# Patient Record
Sex: Female | Born: 1990 | Race: White | Hispanic: No | Marital: Married | Smoking: Never smoker
Health system: Southern US, Community
[De-identification: ages and names within clinical notes are randomized; demographics above are authoritative.]

## PROBLEM LIST (undated history)

## (undated) DIAGNOSIS — Z789 Other specified health status: Secondary | ICD-10-CM

## (undated) HISTORY — PX: NO PAST SURGERIES: SHX2092

---
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
## 2020-11-07 NOTE — L&D Delivery Note (Signed)
Delivery Note  First Stage: Labor onset: 0300 Augmentation: AROM and Pitocin Analgesia /Anesthesia intrapartum: epidural AROM 09/30/21 at 1550  Second Stage: Complete dilation 11/25 at 0031 Onset of pushing 11/25 at 0031 FHR second stage Cat II, fetal tachy 170bpm with early decels.   Delivery of a viable female on 10/01/21 at 0230  by CNM delivery of fetal head in LOA position with restitution to LOT. No nuchal cord;  Anterior then posterior shoulders delivered easily with gentle downward traction. Baby placed on mom's chest, and attended to by peds.  Cord double clamped after cessation of pulsation, cut by FOB. Cord blood sample collected    Third Stage: Placenta delivered spontaneously intact with 3 VC @ 0237 Placenta disposition: routine disposal Uterine tone Firm / bleeding scant  2nd deg perineal and left periurethral lacerations identified  Anesthesia for repair: epidural Repair 2-0 Vicryl CT-1 and 3-0 Vicryl SH Est. Blood Loss (mL): 150  Complications: none  Mom to postpartum.  Baby to Couplet care / Skin to Skin.  Newborn: Birth Weight: pending  Apgar Scores: 8/9 Feeding planned: breast

## 2021-03-10 LAB — OB RESULTS CONSOLE VARICELLA ZOSTER ANTIBODY, IGG: Varicella: IMMUNE

## 2021-03-10 LAB — OB RESULTS CONSOLE RUBELLA ANTIBODY, IGM: Rubella: IMMUNE

## 2021-09-02 LAB — OB RESULTS CONSOLE RPR: RPR: NONREACTIVE

## 2021-09-02 LAB — OB RESULTS CONSOLE HIV ANTIBODY (ROUTINE TESTING): HIV: NONREACTIVE

## 2021-09-02 LAB — OB RESULTS CONSOLE GBS: GBS: NEGATIVE

## 2021-09-30 ENCOUNTER — Inpatient Hospital Stay
Admission: EM | Admit: 2021-09-30 | Discharge: 2021-10-02 | DRG: 807 | Disposition: A | Discharge status: Home / Self Care | Payer: Managed Care, Other (non HMO) | Attending: Obstetrics | Admitting: Obstetrics

## 2021-09-30 ENCOUNTER — Other Ambulatory Visit: Payer: Self-pay

## 2021-09-30 ENCOUNTER — Encounter: Payer: Self-pay | Admitting: Obstetrics and Gynecology

## 2021-09-30 ENCOUNTER — Observation Stay
Admission: EM | Admit: 2021-09-30 | Discharge: 2021-09-30 | Disposition: A | Discharge status: Home / Self Care | Payer: Managed Care, Other (non HMO) | Source: Home / Self Care | Admitting: Obstetrics and Gynecology

## 2021-09-30 ENCOUNTER — Inpatient Hospital Stay: Payer: Managed Care, Other (non HMO) | Admitting: Anesthesiology

## 2021-09-30 DIAGNOSIS — Z3A40 40 weeks gestation of pregnancy: Secondary | ICD-10-CM | POA: Diagnosis not present

## 2021-09-30 DIAGNOSIS — Z20822 Contact with and (suspected) exposure to covid-19: Secondary | ICD-10-CM | POA: Diagnosis present

## 2021-09-30 DIAGNOSIS — O480 Post-term pregnancy: Secondary | ICD-10-CM | POA: Diagnosis present

## 2021-09-30 DIAGNOSIS — O26893 Other specified pregnancy related conditions, third trimester: Principal | ICD-10-CM | POA: Diagnosis present

## 2021-09-30 DIAGNOSIS — Z6791 Unspecified blood type, Rh negative: Secondary | ICD-10-CM | POA: Diagnosis not present

## 2021-09-30 DIAGNOSIS — O471 False labor at or after 37 completed weeks of gestation: Secondary | ICD-10-CM | POA: Insufficient documentation

## 2021-09-30 DIAGNOSIS — D519 Vitamin B12 deficiency anemia, unspecified: Secondary | ICD-10-CM | POA: Insufficient documentation

## 2021-09-30 DIAGNOSIS — N83201 Unspecified ovarian cyst, right side: Secondary | ICD-10-CM | POA: Diagnosis present

## 2021-09-30 DIAGNOSIS — O3483 Maternal care for other abnormalities of pelvic organs, third trimester: Secondary | ICD-10-CM | POA: Insufficient documentation

## 2021-09-30 DIAGNOSIS — O99283 Endocrine, nutritional and metabolic diseases complicating pregnancy, third trimester: Secondary | ICD-10-CM | POA: Insufficient documentation

## 2021-09-30 DIAGNOSIS — O479 False labor, unspecified: Secondary | ICD-10-CM | POA: Diagnosis present

## 2021-09-30 DIAGNOSIS — E559 Vitamin D deficiency, unspecified: Secondary | ICD-10-CM | POA: Insufficient documentation

## 2021-09-30 HISTORY — DX: Other specified health status: Z78.9

## 2021-09-30 LAB — CBC
HCT: 34.8 % — ABNORMAL LOW (ref 36.0–46.0)
Hemoglobin: 11.9 g/dL — ABNORMAL LOW (ref 12.0–15.0)
MCH: 28.7 pg (ref 26.0–34.0)
MCHC: 34.2 g/dL (ref 30.0–36.0)
MCV: 84.1 fL (ref 80.0–100.0)
Platelets: 208 10*3/uL (ref 150–400)
RBC: 4.14 MIL/uL (ref 3.87–5.11)
RDW: 13.2 % (ref 11.5–15.5)
WBC: 14 10*3/uL — ABNORMAL HIGH (ref 4.0–10.5)
nRBC: 0 % (ref 0.0–0.2)

## 2021-09-30 LAB — ABO/RH: ABO/RH(D): A NEG

## 2021-09-30 LAB — TYPE AND SCREEN
ABO/RH(D): A NEG
Antibody Screen: POSITIVE

## 2021-09-30 LAB — RESP PANEL BY RT-PCR (FLU A&B, COVID) ARPGX2
Influenza A by PCR: NEGATIVE
Influenza B by PCR: NEGATIVE
SARS Coronavirus 2 by RT PCR: NEGATIVE

## 2021-09-30 MED ORDER — FENTANYL-BUPIVACAINE-NACL 0.5-0.125-0.9 MG/250ML-% EP SOLN
12.0000 mL/h | EPIDURAL | Status: DC | PRN
  Administered 2021-09-30: 12 mL/h via EPIDURAL

## 2021-09-30 MED ORDER — DIPHENHYDRAMINE HCL 50 MG/ML IJ SOLN: 12.5000 mg | INTRAMUSCULAR | Status: DC | PRN

## 2021-09-30 MED ORDER — LIDOCAINE HCL (PF) 1 % IJ SOLN
30.0000 mL | INTRAMUSCULAR | Status: DC | PRN
  Filled 2021-09-30: qty 30

## 2021-09-30 MED ORDER — OXYTOCIN-SODIUM CHLORIDE 30-0.9 UT/500ML-% IV SOLN
1.0000 m[IU]/min | INTRAVENOUS | Status: DC
  Administered 2021-09-30: 2 m[IU]/min via INTRAVENOUS

## 2021-09-30 MED ORDER — SOD CITRATE-CITRIC ACID 500-334 MG/5ML PO SOLN: 30.0000 mL | ORAL | Status: DC | PRN

## 2021-09-30 MED ORDER — ONDANSETRON HCL 4 MG/2ML IJ SOLN
INTRAMUSCULAR | Status: AC
  Filled 2021-09-30: qty 2

## 2021-09-30 MED ORDER — FENTANYL-BUPIVACAINE-NACL 0.5-0.125-0.9 MG/250ML-% EP SOLN
EPIDURAL | Status: AC
  Filled 2021-09-30: qty 250

## 2021-09-30 MED ORDER — PHENYLEPHRINE 40 MCG/ML (10ML) SYRINGE FOR IV PUSH (FOR BLOOD PRESSURE SUPPORT)
80.0000 ug | PREFILLED_SYRINGE | INTRAVENOUS | Status: DC | PRN
  Filled 2021-09-30: qty 10

## 2021-09-30 MED ORDER — ONDANSETRON HCL 4 MG/2ML IJ SOLN
4.0000 mg | Freq: Four times a day (QID) | INTRAMUSCULAR | Status: DC | PRN
  Administered 2021-09-30: 4 mg via INTRAVENOUS

## 2021-09-30 MED ORDER — OXYTOCIN BOLUS FROM INFUSION
333.0000 mL | Freq: Once | INTRAVENOUS | Status: AC
  Administered 2021-10-01: 333 mL via INTRAVENOUS

## 2021-09-30 MED ORDER — ACETAMINOPHEN 325 MG PO TABS: 650.0000 mg | ORAL_TABLET | ORAL | Status: DC | PRN

## 2021-09-30 MED ORDER — DOCUSATE SODIUM 100 MG PO CAPS: 100.0000 mg | ORAL_CAPSULE | Freq: Every day | ORAL | Status: DC

## 2021-09-30 MED ORDER — TERBUTALINE SULFATE 1 MG/ML IJ SOLN: 0.2500 mg | Freq: Once | INTRAMUSCULAR | Status: DC | PRN

## 2021-09-30 MED ORDER — PRENATAL MULTIVITAMIN CH: 1.0000 | ORAL_TABLET | Freq: Every day | ORAL | Status: DC

## 2021-09-30 MED ORDER — LACTATED RINGERS IV SOLN
500.0000 mL | Freq: Once | INTRAVENOUS | Status: AC
  Administered 2021-09-30: 500 mL via INTRAVENOUS

## 2021-09-30 MED ORDER — FENTANYL CITRATE (PF) 100 MCG/2ML IJ SOLN
100.0000 ug | INTRAMUSCULAR | Status: DC | PRN
  Administered 2021-09-30: 100 ug via INTRAVENOUS

## 2021-09-30 MED ORDER — SODIUM CHLORIDE 0.9 % IV SOLN
INTRAVENOUS | Status: DC | PRN
  Administered 2021-09-30 (×4): 5 mL via EPIDURAL

## 2021-09-30 MED ORDER — LACTATED RINGERS IV SOLN
500.0000 mL | INTRAVENOUS | Status: DC | PRN
  Administered 2021-09-30: 500 mL via INTRAVENOUS

## 2021-09-30 MED ORDER — CALCIUM CARBONATE ANTACID 500 MG PO CHEW: 2.0000 | CHEWABLE_TABLET | ORAL | Status: DC | PRN

## 2021-09-30 MED ORDER — OXYTOCIN-SODIUM CHLORIDE 30-0.9 UT/500ML-% IV SOLN
2.5000 [IU]/h | INTRAVENOUS | Status: DC
  Filled 2021-09-30: qty 500

## 2021-09-30 MED ORDER — FENTANYL CITRATE (PF) 100 MCG/2ML IJ SOLN
INTRAMUSCULAR | Status: AC
  Filled 2021-09-30: qty 2

## 2021-09-30 MED ORDER — OXYTOCIN-SODIUM CHLORIDE 30-0.9 UT/500ML-% IV SOLN
INTRAVENOUS | Status: AC
  Filled 2021-09-30: qty 1000

## 2021-09-30 MED ORDER — EPHEDRINE 5 MG/ML INJ
10.0000 mg | INTRAVENOUS | Status: DC | PRN
  Filled 2021-09-30: qty 2

## 2021-09-30 MED ORDER — LIDOCAINE HCL (PF) 1 % IJ SOLN
INTRAMUSCULAR | Status: DC | PRN
  Administered 2021-09-30 (×2): 1 mL via SUBCUTANEOUS

## 2021-09-30 MED ORDER — MISOPROSTOL 200 MCG PO TABS
ORAL_TABLET | ORAL | Status: AC
  Filled 2021-09-30: qty 4

## 2021-09-30 MED ORDER — ZOLPIDEM TARTRATE 5 MG PO TABS: 5.0000 mg | ORAL_TABLET | Freq: Every evening | ORAL | Status: DC | PRN

## 2021-09-30 MED ORDER — AMMONIA AROMATIC IN INHA
RESPIRATORY_TRACT | Status: AC
  Filled 2021-09-30: qty 10

## 2021-09-30 MED ORDER — LIDOCAINE HCL (PF) 1 % IJ SOLN
INTRAMUSCULAR | Status: AC
  Filled 2021-09-30: qty 30

## 2021-09-30 MED ORDER — OXYTOCIN 10 UNIT/ML IJ SOLN
INTRAMUSCULAR | Status: AC
  Filled 2021-09-30: qty 2

## 2021-09-30 MED ORDER — LIDOCAINE-EPINEPHRINE (PF) 1.5 %-1:200000 IJ SOLN
INTRAMUSCULAR | Status: DC | PRN
  Administered 2021-09-30 (×2): 3 mL via EPIDURAL

## 2021-09-30 MED ORDER — LACTATED RINGERS IV SOLN: INTRAVENOUS | Status: DC

## 2021-09-30 NOTE — OB Triage Note (Signed)
Pt Haley Frye 30 y.o. presents to labor and delivery triage reporting contractions q3-5 minutes for the past 2 hours. Pt is a G1P0 at [redacted]w[redacted]d. Pt denies signs and symptoms consistent with rupture of membranes or active vaginal bleeding. Pt states positive fetal movement. External FM and TOCO applied to non-tender abdomen and assessing. Initial FHR 140. Vital signs obtained and within normal limits. Provider notified of pt.

## 2021-09-30 NOTE — Progress Notes (Signed)
Fetal monitoring removed at this time per provider for intermittent fetal monitoring.

## 2021-09-30 NOTE — Discharge Summary (Signed)
Haley Frye is a 30 y.o. female. She is at [redacted]w[redacted]d gestation. No LMP recorded. Patient is pregnant. Estimated Date of Delivery: 09/26/21  Prenatal care site: [facility]   Current pregnancy complicated by:   Right ovarian cyst  RH Negative: 28 weeks [ x] Antibody Screen neg RHOGAM given [facility] 07/09/21 Vitamin B12 and Vit D deficiency  Chief complaint: Painful uterine contractions  S: Resting comfortably. CTX q3-75min, no VB.no LOF,  Active fetal movement.   Denies: HA, visual changes, SOB, or RUQ/epigastric pain  Maternal Medical History:  History reviewed. No pertinent past medical history.  History reviewed. No pertinent surgical history.  Not on File  Prior to Admission medications   Medication Sig Start Date End Date Taking? Authorizing Provider  Prenatal Vit-Fe Fumarate-FA (PRENATAL MULTIVITAMIN) TABS tablet Take 1 tablet by mouth daily at 12 noon.   Yes [provider]      Social History: She  reports that she has never smoked. She has never used smokeless tobacco. She reports that she does not currently use alcohol. She reports that she does not use drugs.  Family History: family history is not on file.  no history of gyn cancers  Review of Systems: A full review of systems was performed and negative except as noted in the HPI.    O:  BP 115/80 (BP Location: Left Arm)   Pulse 81   Temp 98.1 F (36.7 C) (Oral)   Resp 18   Ht 5\' 6"  (1.676 m)   Wt 94.3 kg   BMI 33.57 kg/m  No results found for this or any previous visit (from the past 48 hour(s)).   Constitutional: NAD, AAOx3  HE/ENT: extraocular movements grossly intact, moist mucous membranes CV: RRR PULM: nl respiratory effort, CTABL     Abd: gravid, non-tender, non-distended, soft      Ext: Non-tender, Nonedematous   Psych: mood appropriate, speech normal Pelvic: 2/50/-3  Pelvic exam: normal external genitalia, vulva, vagina, cervix, uterus and adnexa.  Fetal  monitoring: Cat 1  Baseline:  130bpm Variability: moderate Accelerations: present x >2 Decelerations absent Time  A/P: 30 y.o. [redacted]w[redacted]d here for antenatal surveillance for uterine contractions  Principle Diagnosis:  Normal pregnancy in third trimester  Labor: early labor present. Cervix  minimal change since yesterday. She is handling contractions well.  She and FOB are requesting to be discharged and labor at home until active labor. Reviewed signs of early vs active labor and to return with worsening contractions, leaking fluid, decreased fetal movement, or with any concerns. They are agreeable with POC. Fetal Wellbeing: Reassuring Cat 1 tracing. Reactive NST  D/c home stable, precautions reviewed, follow-up as scheduled.    [redacted]w[redacted]d, CNM 09/30/2021 6:45 AM

## 2021-09-30 NOTE — Anesthesia Procedure Notes (Signed)
Epidural Patient location during procedure: OB Start time: 09/30/2021 8:41 PM End time: 09/30/2021 8:44 PM  Staffing Anesthesiologist: Arne Schlender, Cleda Mccreedy, MD Performed: anesthesiologist   Preanesthetic Checklist Completed: patient identified, IV checked, site marked, risks and benefits discussed, surgical consent, monitors and equipment checked, pre-op evaluation and timeout performed  Epidural Patient position: sitting Prep: ChloraPrep Patient monitoring: heart rate, continuous pulse ox and blood pressure Approach: midline Location: L3-L4 Injection technique: LOR saline  Needle:  Needle type: Tuohy  Needle gauge: 17 G Needle length: 9 cm and 9 Needle insertion depth: 5 cm Catheter type: closed end flexible Catheter size: 19 Gauge Catheter at skin depth: 10 cm Test dose: negative and 1.5% lidocaine with Epi 1:200 K  Assessment Sensory level: T10 Events: blood not aspirated, injection not painful, no injection resistance, no paresthesia and negative IV test  Additional Notes 1 attempt Pt. Evaluated and documentation done after procedure finished. Patient identified. Risks/Benefits/Options discussed with patient including but not limited to bleeding, infection, nerve damage, paralysis, failed block, incomplete pain control, headache, blood pressure changes, nausea, vomiting, reactions to medication both or allergic, itching and postpartum back pain. Confirmed with bedside nurse the patient's most recent platelet count. Confirmed with patient that they are not currently taking any anticoagulation, have any bleeding history or any family history of bleeding disorders. Patient expressed understanding and wished to proceed. All questions were answered. Sterile technique was used throughout the entire procedure. Please see nursing notes for vital signs. Test dose was given through epidural catheter and negative prior to continuing to dose epidural or start infusion. Warning signs of  high block given to the patient including shortness of breath, tingling/numbness in hands, complete motor block, or any concerning symptoms with instructions to call for help. Patient was given instructions on fall risk and not to get out of bed. All questions and concerns addressed with instructions to call with any issues or inadequate analgesia.    Patient tolerated the insertion well without immediate complications.Reason for block:procedure for pain

## 2021-09-30 NOTE — Anesthesia Procedure Notes (Signed)
Epidural Patient location during procedure: OB Start time: 09/30/2021 1:50 PM End time: 09/30/2021 1:52 PM  Staffing Anesthesiologist: Fermon Ureta, Cleda Mccreedy, MD Performed: anesthesiologist   Preanesthetic Checklist Completed: patient identified, IV checked, site marked, risks and benefits discussed, surgical consent, monitors and equipment checked, pre-op evaluation and timeout performed  Epidural Patient position: sitting Prep: ChloraPrep Patient monitoring: heart rate, continuous pulse ox and blood pressure Approach: midline Location: L3-L4 Injection technique: LOR saline  Needle:  Needle type: Tuohy  Needle gauge: 17 G Needle length: 9 cm and 9 Needle insertion depth: 6 cm Catheter type: closed end flexible Catheter size: 19 Gauge Catheter at skin depth: 11 cm Test dose: negative and 1.5% lidocaine with Epi 1:200 K  Assessment Sensory level: T10 Events: blood not aspirated, injection not painful, no injection resistance, no paresthesia and negative IV test  Additional Notes 1 attempt Pt. Evaluated and documentation done after procedure finished. Patient identified. Risks/Benefits/Options discussed with patient including but not limited to bleeding, infection, nerve damage, paralysis, failed block, incomplete pain control, headache, blood pressure changes, nausea, vomiting, reactions to medication both or allergic, itching and postpartum back pain. Confirmed with bedside nurse the patient's most recent platelet count. Confirmed with patient that they are not currently taking any anticoagulation, have any bleeding history or any family history of bleeding disorders. Patient expressed understanding and wished to proceed. All questions were answered. Sterile technique was used throughout the entire procedure. Please see nursing notes for vital signs. Test dose was given through epidural catheter and negative prior to continuing to dose epidural or start infusion. Warning signs of  high block given to the patient including shortness of breath, tingling/numbness in hands, complete motor block, or any concerning symptoms with instructions to call for help. Patient was given instructions on fall risk and not to get out of bed. All questions and concerns addressed with instructions to call with any issues or inadequate analgesia.    Patient tolerated the insertion well without immediate complications.Reason for block:procedure for pain

## 2021-09-30 NOTE — Progress Notes (Signed)
   09/30/21 1545  Clinical Encounter Type  Visited With Patient and family together  Visit Type Initial;Spiritual support;Social support  Referral From Nurse  Consult/Referral To Chaplain  Recommendations Advanced directive  Spiritual Encounters  Spiritual Needs Other (Comment)  Chaplain Burris delivered advanced directive document. Pt and spouse interested in this as they shared that a family member had once been in need of this planning, so this was very relevant. Pt has begun epidural, so did not prolong discussion but left document for them. Encouraged them to let nursing staff know when they may want to complete this.

## 2021-09-30 NOTE — Progress Notes (Signed)
Labor Progress Note  Sammantha Mehlhaff is a 30 y.o. G1P0 at [redacted]w[redacted]d by LMP admitted for latent labor.   Subjective: feeling more discomfort again, mostly in lower abdomen and hips bilaterally. Right leg remains very numb.   Objective: BP (!) 119/59   Pulse 73   Temp 98.2 F (36.8 C) (Oral)   Resp 18   Ht 5\' 6"  (1.676 m)   Wt 94.3 kg   SpO2 99%   BMI 33.57 kg/m  Notable VS details: reviewed  Fetal Assessment: FHT:  FHR: 140 bpm, variability: moderate,  accelerations:  Present,  decelerations:  Absent Category/reactivity:  Category I UC:   regular, every 3-6 minutes, coupling SVE:   5-6/100/-1, soft, anterior; small amount bloody show.   Membrane status: AROM at 1550 Amniotic color: clear  Labs: Lab Results  Component Value Date   WBC 14.0 (H) 09/30/2021   HGB 11.9 (L) 09/30/2021   HCT 34.8 (L) 09/30/2021   MCV 84.1 09/30/2021   PLT 208 09/30/2021    Assessment / Plan: G1P0 at 40.4wks in latent labor  Labor:  some cervical change since admission, AROM last exam. Encouraged position changes. IUPC placed, wioll start Pitocin due to slow change Preeclampsia:  no e/o pre-E Fetal Wellbeing:  Category I Pain Control:  Epidural I/D:  n/a Anticipated MOD:  NSVD  10/02/2021, CNM 09/30/2021, 8:06 PM

## 2021-09-30 NOTE — H&P (Signed)
OB History & Physical   History of Present Illness:  Chief Complaint: painful UCs  HPI:  Haley Frye is a 30 y.o. G1P0 female at [redacted]w[redacted]d dated by LMP 12/20/20 c/w [place] at [redacted]w[redacted]d. EDD 09/26/21 - She presents to L&D for more painful UCs since about 9.30; starting at 0300 this morning. Has had a very small amount of bloody show, reports good FM.     Pregnancy Issues: 1. Right ovarian cyst- complex with solid component, plan to f/u PP with [place].  2. RH Negative, rhogam given 07/09/21 3. Vitamin B12 and D Deficiencies.    Maternal Medical History:   Past Medical History:  Diagnosis Date   Medical history non-contributory     Past Surgical History:  Procedure Laterality Date   NO PAST SURGERIES      Not on File  Prior to Admission medications   Medication Sig Start Date End Date Taking? Authorizing Provider  Prenatal Vit-Fe Fumarate-FA (PRENATAL MULTIVITAMIN) TABS tablet Take 1 tablet by mouth daily at 12 noon.    [provider]     Pren[facility] care site: [facility]  Social History: She  reports that she has never smoked. She has never used smokeless tobacco. She reports that she does not currently use alcohol. She reports that she does not use drugs.  Family History:no family hx Gyn cancers  Review of Systems: A full review of systems was performed and negative except as noted in the HPI.     Physical Exam:  Vital Signs: There were no vitals taken for this visit. General: no acute distress.  HEENT: normocephalic, atraumatic Heart: regular rate & rhythm.  No murmurs/rubs/gallops Lungs: clear to auscultation bilaterally, normal respiratory effort Abdomen: soft, gravid, non-tender;  EFW: 8lbs Pelvic:   External: Normal external female genitalia  Cervix: 3/90/-1, soft/mid   Extremities: non-tender, symmetric, no edema bilaterally.  DTRs: 2+  Neurologic: Alert & oriented x 3.    No results found for this or any previous visit (from the past 24  hour(s)).  Pertinent Results:  Prenatal Labs: Blood type/Rh A neg  Antibody screen Neg; rhogam given 07/09/21  Rubella Immune  Varicella Immune  RPR NR  HBsAg Neg  HIV NR  GC neg  Chlamydia neg  Genetic screening  declined  1 hour GTT  74  3 hour GTT  N/a  GBS  neg   FHT: 130bpm, mod variability, + accels, no decels.  TOCO: q2-29min, palp mod   Cephalic by leopolds and SVE  No results found.  Assessment:  Haley Frye is a 30 y.o. G1P0 female at [redacted]w[redacted]d with early labor.   Plan:  1. Admit to Labor & Delivery; consents reviewed and obtained - pt desires low intervention birth and prefers to avoid pitocin if possible.  - cervical change since triage visit at 0500.   2. Fetal Well being  - Fetal Tracing: Cat I - Group B Streptococcus ppx indicated: negative - Presentation: cephalic confirmed by exam and Leopolds.    3. Routine OB: - Prenatal labs reviewed, as above - Rh A neg - CBC, T&S, RPR on admit - Clear fluids, saline lock  4. Monitoring of Labor -  Contractions: external toco in place -  Pelvis adequate for TOL.  -  Plan for augment with AROM if no cervical change.  -  Plan for continuous fetal monitoring  -  Maternal pain control as desired; requesting IVPM, nitrous - Anticipate vaginal delivery  5. Post Partum Planning: - Infant feeding:  breast - Contraception: NFP  Randa Ngo, CNM 09/30/21 11:37 AM

## 2021-09-30 NOTE — Progress Notes (Signed)
Labor Progress Note  Haley Frye is a 30 y.o. G1P0 at [redacted]w[redacted]d by LMP admitted for latent labor.   Subjective: comfortable after epidural.   Objective: BP (!) 119/59   Pulse 73   Temp 98.2 F (36.8 C) (Oral)   Resp 18   Ht 5\' 6"  (1.676 m)   Wt 94.3 kg   SpO2 99%   BMI 33.57 kg/m  Notable VS details: reviewed  Fetal Assessment: FHT:  FHR: 145 bpm, variability: moderate,  accelerations:  Present,  decelerations:  Absent Category/reactivity:  Category I UC:   regular, every 3-5 minutes SVE:   4/100/-1, soft, mid; AROM performed, small amount clear fluid. Small amt bloody show.   Membrane status: AROM at 1550 Amniotic color: clear  Labs: Lab Results  Component Value Date   WBC 14.0 (H) 09/30/2021   HGB 11.9 (L) 09/30/2021   HCT 34.8 (L) 09/30/2021   MCV 84.1 09/30/2021   PLT 208 09/30/2021    Assessment / Plan: G1P0 at 40.4wks in latent labor  Labor:  some cervical change since admission, AROM performed now. Encouraged position changes. Will discuss Pitocin if no cervical change in 4hours.  Preeclampsia:  no e/0 pre-E Fetal Wellbeing:  Category I Pain Control:  Epidural I/D:  n/a Anticipated MOD:  NSVD  10/02/2021 Opal Mckellips, CNM 09/30/2021, 6:01 PM

## 2021-09-30 NOTE — OB Triage Note (Signed)

## 2021-09-30 NOTE — Anesthesia Preprocedure Evaluation (Signed)
Anesthesia Evaluation  Patient identified by MRN, date of birth, ID band Patient awake    Reviewed: Allergy & Precautions, NPO status , Patient's Chart, lab work & pertinent test results  History of Anesthesia Complications Negative for: history of anesthetic complications  Airway Mallampati: III  TM Distance: <3 FB Neck ROM: full    Dental  (+) Chipped   Pulmonary neg pulmonary ROS,    Pulmonary exam normal        Cardiovascular Exercise Tolerance: Good (-) hypertensionnegative cardio ROS Normal cardiovascular exam     Neuro/Psych    GI/Hepatic negative GI ROS,   Endo/Other    Renal/GU   negative genitourinary   Musculoskeletal   Abdominal   Peds  Hematology negative hematology ROS (+)   Anesthesia Other Findings Past Medical History: No date: Medical history non-contributory  Past Surgical History: No date: NO PAST SURGERIES  BMI    Body Mass Index: 33.57 kg/m      Reproductive/Obstetrics (+) Pregnancy                             Anesthesia Physical Anesthesia Plan  ASA: 2  Anesthesia Plan: Epidural   Post-op Pain Management:    Induction:   PONV Risk Score and Plan:   Airway Management Planned: Natural Airway  Additional Equipment:   Intra-op Plan:   Post-operative Plan:   Informed Consent: I have reviewed the patients History and Physical, chart, labs and discussed the procedure including the risks, benefits and alternatives for the proposed anesthesia with the patient or authorized representative who has indicated his/her understanding and acceptance.     Dental Advisory Given  Plan Discussed with: Anesthesiologist  Anesthesia Plan Comments: (Patient reports no bleeding problems and no anticoagulant use.   Patient consented for risks of anesthesia including but not limited to:  - adverse reactions to medications - risk of bleeding, infection and or  nerve damage from epidural that could lead to paralysis - risk of headache or failed epidural - nerve damage due to positioning - that if epidural is used for C-section that there is a chance of epidural failure requiring spinal placement or conversion to GA - Damage to heart, brain, lungs, other parts of body or loss of life  Patient voiced understanding.)        Anesthesia Quick Evaluation

## 2021-10-01 ENCOUNTER — Encounter: Payer: Self-pay | Admitting: Obstetrics and Gynecology

## 2021-10-01 LAB — CBC
HCT: 30.2 % — ABNORMAL LOW (ref 36.0–46.0)
Hemoglobin: 10.2 g/dL — ABNORMAL LOW (ref 12.0–15.0)
MCH: 28.7 pg (ref 26.0–34.0)
MCHC: 33.8 g/dL (ref 30.0–36.0)
MCV: 84.8 fL (ref 80.0–100.0)
Platelets: 183 10*3/uL (ref 150–400)
RBC: 3.56 MIL/uL — ABNORMAL LOW (ref 3.87–5.11)
RDW: 13.3 % (ref 11.5–15.5)
WBC: 17.1 10*3/uL — ABNORMAL HIGH (ref 4.0–10.5)
nRBC: 0 % (ref 0.0–0.2)

## 2021-10-01 LAB — RPR: RPR Ser Ql: NONREACTIVE

## 2021-10-01 LAB — FETAL SCREEN: Fetal Screen: NEGATIVE

## 2021-10-01 MED ORDER — ONDANSETRON HCL 4 MG PO TABS: 4.0000 mg | ORAL_TABLET | ORAL | Status: DC | PRN

## 2021-10-01 MED ORDER — ZOLPIDEM TARTRATE 5 MG PO TABS: 5.0000 mg | ORAL_TABLET | Freq: Every evening | ORAL | Status: DC | PRN

## 2021-10-01 MED ORDER — IBUPROFEN 600 MG PO TABS: 600.0000 mg | ORAL_TABLET | Freq: Four times a day (QID) | ORAL | Status: DC

## 2021-10-01 MED ORDER — ACETAMINOPHEN 325 MG PO TABS
650.0000 mg | ORAL_TABLET | ORAL | Status: DC | PRN
  Administered 2021-10-01: 650 mg via ORAL
  Filled 2021-10-01: qty 2

## 2021-10-01 MED ORDER — BENZOCAINE-MENTHOL 20-0.5 % EX AERO
1.0000 "application " | INHALATION_SPRAY | CUTANEOUS | Status: DC | PRN
  Filled 2021-10-01: qty 56

## 2021-10-01 MED ORDER — WITCH HAZEL-GLYCERIN EX PADS
1.0000 "application " | MEDICATED_PAD | CUTANEOUS | Status: DC | PRN
  Filled 2021-10-01: qty 100

## 2021-10-01 MED ORDER — IBUPROFEN 600 MG PO TABS
600.0000 mg | ORAL_TABLET | Freq: Four times a day (QID) | ORAL | Status: DC
  Administered 2021-10-01 – 2021-10-02 (×5): 600 mg via ORAL
  Filled 2021-10-01 (×5): qty 1

## 2021-10-01 MED ORDER — COCONUT OIL OIL
1.0000 "application " | TOPICAL_OIL | Status: DC | PRN
  Filled 2021-10-01: qty 120

## 2021-10-01 MED ORDER — PRENATAL MULTIVITAMIN CH
1.0000 | ORAL_TABLET | Freq: Every day | ORAL | Status: DC
  Administered 2021-10-01: 1 via ORAL
  Filled 2021-10-01: qty 1

## 2021-10-01 MED ORDER — SENNOSIDES-DOCUSATE SODIUM 8.6-50 MG PO TABS
2.0000 | ORAL_TABLET | Freq: Every day | ORAL | Status: DC
  Administered 2021-10-02: 2 via ORAL
  Filled 2021-10-01: qty 2

## 2021-10-01 MED ORDER — FERROUS SULFATE 325 (65 FE) MG PO TABS
325.0000 mg | ORAL_TABLET | Freq: Two times a day (BID) | ORAL | Status: DC
  Administered 2021-10-01 – 2021-10-02 (×3): 325 mg via ORAL
  Filled 2021-10-01 (×3): qty 1

## 2021-10-01 MED ORDER — ONDANSETRON HCL 4 MG/2ML IJ SOLN: 4.0000 mg | INTRAMUSCULAR | Status: DC | PRN

## 2021-10-01 MED ORDER — DIBUCAINE (PERIANAL) 1 % EX OINT
1.0000 "application " | TOPICAL_OINTMENT | CUTANEOUS | Status: DC | PRN
  Filled 2021-10-01: qty 28

## 2021-10-01 MED ORDER — SIMETHICONE 80 MG PO CHEW: 80.0000 mg | CHEWABLE_TABLET | ORAL | Status: DC | PRN

## 2021-10-01 MED ORDER — DIPHENHYDRAMINE HCL 25 MG PO CAPS: 25.0000 mg | ORAL_CAPSULE | Freq: Four times a day (QID) | ORAL | Status: DC | PRN

## 2021-10-01 MED ORDER — RHO D IMMUNE GLOBULIN 1500 UNIT/2ML IJ SOSY
300.0000 ug | PREFILLED_SYRINGE | Freq: Once | INTRAMUSCULAR | Status: AC
  Administered 2021-10-01: 300 ug via INTRAMUSCULAR
  Filled 2021-10-01: qty 2

## 2021-10-01 MED ORDER — TETANUS-DIPHTH-ACELL PERTUSSIS 5-2.5-18.5 LF-MCG/0.5 IM SUSY: 0.5000 mL | PREFILLED_SYRINGE | Freq: Once | INTRAMUSCULAR | Status: DC

## 2021-10-01 NOTE — Progress Notes (Signed)
Labor Progress Note  Haley Frye is a 30 y.o. G1P0 at [redacted]w[redacted]d by LMP admitted for latent labor.   Subjective: feeling pressure with UCs, desires to push.   Objective: BP 122/73   Pulse 82   Temp 98.7 F (37.1 C) (Oral)   Resp 18   Ht 5\' 6"  (1.676 m)   Wt 94.3 kg   SpO2 99%   BMI 33.57 kg/m  Notable VS details: reviewed  Fetal Assessment: FHT:  FHR: 135 bpm, variability: moderate,  accelerations:  Present,  decelerations:  Absent Category/reactivity:  Category I UC:   regular, every 1.5-3 minutes, PItocin at 55mu/min SVE:   C/C/+1  Membrane status: AROM at 1550 Amniotic color: clear  Labs: Lab Results  Component Value Date   WBC 14.0 (H) 09/30/2021   HGB 11.9 (L) 09/30/2021   HCT 34.8 (L) 09/30/2021   MCV 84.1 09/30/2021   PLT 208 09/30/2021    Assessment / Plan: G1P0 at 40.5wks, 2nd stage labor  Labor: now second stage, Pushing now, no descent yet, changing positions.  Preeclampsia:  no e/o pre-E Fetal Wellbeing:  Category I Pain Control:  Epidural I/D:  n/a Anticipated MOD:  NSVD  10/02/2021, CNM 10/01/2021, 12:43 AM

## 2021-10-01 NOTE — Discharge Summary (Signed)
Obstetrical Discharge Summary  Patient Name: Haley Frye DOB: [birth year] MRN: [id number]  Date of Admission: 09/30/2021 Date of Delivery: 10/01/21 Delivered by: Dala Dock CNM Date of Discharge: 10/02/2021   Primary OB: [facility]  LMP:No LMP recorded. EDC Estimated Date of Delivery: 09/26/21 Gestational Age at Delivery: [redacted]w[redacted]d   Antepartum complications:  1. Right ovarian cyst- complex with solid component, plan to f/u PP with [place].  2. RH Negative, rhogam given 07/09/21 3. Vitamin B12 and D Deficiencies.   Admitting Diagnosis: labor, 40.5wks Secondary Diagnosis: SVD, 2nd deg lac Patient Active Problem List   Diagnosis Date Noted   Uterine contractions 09/30/2021   Post-term pregnancy, 40-42 weeks of gestation 09/30/2021    Augmentation: AROM and Pitocin Complications: None Intrapartum complications/course: see delivery note Date of Delivery: 10/01/21 Delivered By: McVey CNM Delivery Type: spontaneous vaginal delivery Anesthesia: epidural Placenta: spontaneous Laceration: 2nd deg lac, left periurethral Episiotomy: none Newborn Data: Live born female "Kathlene Cote" Birth Weight:  7#15  APGAR: 8, 9  Newborn Delivery   Birth date/time: 10/01/2021 02:30:00 Delivery type: Vaginal, Spontaneous     Postpartum Procedures:   Inocente Salles:  Edinburgh Postnatal Depression Scale Screening Tool 10/01/2021  I have been able to laugh and see the funny side of things. 0  I have looked forward with enjoyment to things. 0  I have blamed myself unnecessarily when things went wrong. 1  I have been anxious or worried for no good reason. 1  I have felt scared or panicky for no good reason. 0  Things have been getting on top of me. 3  I have been so unhappy that I have had difficulty sleeping. 0  I have felt sad or miserable. 0  I have been so unhappy that I have been crying. 0  The thought of harming myself has occurred to me. 0  Edinburgh Postnatal Depression Scale Total 5       Post partum course:  Patient had an uncomplicated postpartum course.  By time of discharge on PPD#1, her pain was controlled on oral pain medications; she had appropriate lochia and was ambulating, voiding without difficulty and tolerating regular diet.  She was deemed stable for discharge to home.     Discharge Physical Exam:  BP 103/69 (BP Location: Right Arm)   Pulse 91   Temp 98.3 F (36.8 C) (Oral)   Resp 20   Ht 5\' 6"  (1.676 m)   Wt 94.3 kg   SpO2 100%   Breastfeeding Unknown   BMI 33.57 kg/m   General: NAD CV: RRR Pulm: CTABL, nl effort ABD: s/nd/nt, fundus firm and below the umbilicus Lochia: moderate Perineum: well approximated/intact DVT Evaluation: LE non-ttp, no evidence of DVT on exam.  Hemoglobin  Date Value Ref Range Status  10/01/2021 10.2 (L) 12.0 - 15.0 g/dL Final   HCT  Date Value Ref Range Status  10/01/2021 30.2 (L) 36.0 - 46.0 % Final     Disposition: stable, discharge to home. Baby Feeding: breastmilk Baby Disposition: home with mom  Rh Immune globulin given: 10/01/21 Rubella vaccine given: immune Varicella vaccine given: immune Tdap vaccine given in AP or PP setting: 07/08/21 Flu vaccine given in AP or PP setting: 08/2021  Contraception: NFP  Prenatal Labs:  Blood type/Rh A neg  Antibody screen Neg; rhogam given 07/09/21  Rubella Immune  Varicella Immune  RPR NR  HBsAg Neg  HIV NR  GC neg  Chlamydia neg  Genetic screening  declined  1 hour GTT  74  3 hour GTT  N/a  GBS  neg      Plan:  Audryana Hockenberry was discharged to home in good condition. Follow-up appointment with delivering provider in 6 weeks.  Discharge Medications: Allergies as of 10/02/2021   No Known Allergies      Medication List     TAKE these medications    acetaminophen 325 MG tablet Commonly known as: Tylenol Take 2 tablets (650 mg total) by mouth every 4 (four) hours as needed (for pain scale < 4).   benzocaine-Menthol 20-0.5 % Aero Commonly known  as: DERMOPLAST Apply 1 application topically as needed for irritation (perineal discomfort).   coconut oil Oil Apply 1 application topically as needed.   dibucaine 1 % Oint Commonly known as: NUPERCAINAL Place 1 application rectally as needed for hemorrhoids.   ferrous sulfate 325 (65 FE) MG tablet Take 1 tablet (325 mg total) by mouth 2 (two) times daily with a meal.   ibuprofen 600 MG tablet Commonly known as: ADVIL Take 1 tablet (600 mg total) by mouth every 6 (six) hours.   prenatal multivitamin Tabs tablet Take 1 tablet by mouth daily at 12 noon.   senna-docusate 8.6-50 MG tablet Commonly known as: Senokot-S Take 2 tablets by mouth daily. Start taking on: October 03, 2021   simethicone 80 MG chewable tablet Commonly known as: MYLICON Chew 1 tablet (80 mg total) by mouth as needed for flatulence.   witch hazel-glycerin pad Commonly known as: TUCKS Apply 1 application topically as needed for hemorrhoids.         Follow-up Information     McVey, Prudencio Pair, CNM Follow up in 6 week(s).   Specialty: Obstetrics and Gynecology Why: PP visit Contact information: [address] [place] [address] [phone]                 Signed:  Sonny Dandy, CNM 10/02/2021  11:03 AM

## 2021-10-01 NOTE — Lactation Note (Signed)
This note was copied from a baby's chart. Lactation Consultation Note  Patient Name: Haley Frye [id number] Date: 10/01/2021 Reason for consult: Initial assessment;1st time breastfeeding Age:29 hours Lactation to the room for initial visit. Father is holding the baby skin to skin. Encouraged feeding on demand and with cues. If baby is not cueing encouraged hand expression and skin to skin. Encouraged 8 or more attempts in the first 24 hours and 8 or more good feeds after 24 HOL. Reviewed appropriate diapers for days of life and How to know your baby is getting enough to eat. Reviewed "Understanding Postpartum and Newborn Care" booklet at bedside. [facility] # left on board, encouraged to call for any assistance. Family members to the room, parents will call later if they have needs. Mother has no further questions at this time.   Maternal Data Has patient been taught Hand Expression?: Yes Does the patient have breastfeeding experience prior to this delivery?: No  Feeding Mother's Current Feeding Choice: Breast Milk  Interventions Interventions: Breast feeding basics reviewed;Hand express;Education  Discharge Pump: Personal (has a pump at home)  Consult Status Consult Status: Follow-up Follow-up type: Call as needed    Labrandon Knoch D Patrik Turnbaugh 10/01/2021, 1:21 PM

## 2021-10-02 LAB — RHOGAM INJECTION: Unit division: 0

## 2021-10-02 MED ORDER — WITCH HAZEL-GLYCERIN EX PADS: 1.0000 "application " | MEDICATED_PAD | CUTANEOUS | 12 refills | Status: AC | PRN

## 2021-10-02 MED ORDER — SIMETHICONE 80 MG PO CHEW: 80.0000 mg | CHEWABLE_TABLET | ORAL | 0 refills | Status: AC | PRN

## 2021-10-02 MED ORDER — DIBUCAINE (PERIANAL) 1 % EX OINT: 1.0000 "application " | TOPICAL_OINTMENT | CUTANEOUS | Status: AC | PRN

## 2021-10-02 MED ORDER — IBUPROFEN 600 MG PO TABS: 600.0000 mg | ORAL_TABLET | Freq: Four times a day (QID) | ORAL | 0 refills | Status: AC

## 2021-10-02 MED ORDER — BENZOCAINE-MENTHOL 20-0.5 % EX AERO: 1.0000 "application " | INHALATION_SPRAY | CUTANEOUS | Status: AC | PRN

## 2021-10-02 MED ORDER — FERROUS SULFATE 325 (65 FE) MG PO TABS: 325.0000 mg | ORAL_TABLET | Freq: Two times a day (BID) | ORAL | 3 refills | Status: AC

## 2021-10-02 MED ORDER — ACETAMINOPHEN 325 MG PO TABS: 650.0000 mg | ORAL_TABLET | ORAL | Status: AC | PRN

## 2021-10-02 MED ORDER — SENNOSIDES-DOCUSATE SODIUM 8.6-50 MG PO TABS: 2.0000 | ORAL_TABLET | Freq: Every day | ORAL | Status: AC

## 2021-10-02 MED ORDER — COCONUT OIL OIL: 1.0000 "application " | TOPICAL_OIL | 0 refills | Status: AC | PRN

## 2021-10-02 NOTE — Progress Notes (Signed)
Patient discharged home with family.  Discharge instructions, when to follow up, and prescriptions reviewed with patient.  Patient verbalized understanding. Patient will be escorted out by auxiliary.   

## 2021-10-02 NOTE — Discharge Instructions (Signed)

## 2021-10-02 NOTE — Lactation Note (Signed)
This note was copied from a baby's chart. Lactation Consultation Note  Patient Name: Girl Kilee Hedding [id number] Date: 10/02/2021 Reason for consult: Follow-up assessment;1st time breastfeeding Age:29 hours Lactation Rounds: LC to the room for a visit. Mother and baby are BF in clutch old on the left in recliner. Mother states feeds are going well. LC reviewed and encouraged feeding on demand and with cues. If baby is not cueing we encourage hand expression and spoon feed to wake baby. Reviewed diaper counts for days of life and when to call Peds with questions. Reviewed "understanding Postpartum and Newborn care " booklet at bedside. Reviewed outpatient Lactation number and resources. LC assisted with positioning on the right in football, reviewed ear, shoulder and hip in alignment with nose to nipple for a deep latch. Reviewed pacifier, pumping, and bottles are not encouraged until breastfeeding is established and going well in the first 4 weeks, unless encouraged to pump by peds for poor weight gain. Parents stated understanding with all teaching.   Maternal Data Has patient been taught Hand Expression?: Yes Does the patient have breastfeeding experience prior to this delivery?: No  Feeding Mother's Current Feeding Choice: Breast Milk  LATCH Score Latch: Grasps breast easily, tongue down, lips flanged, rhythmical sucking.  Audible Swallowing: Spontaneous and intermittent  Type of Nipple: Everted at rest and after stimulation (compressed, lipstick shape in clutch hold)  Comfort (Breast/Nipple): Soft / non-tender  Hold (Positioning): Assistance needed to correctly position infant at breast and maintain latch.  LATCH Score: 9   Interventions Interventions: Assisted with latch;Breast feeding basics reviewed;Hand express;Breast compression;Adjust position;Support pillows;Position options;Education  Discharge Discharge Education: Engorgement and breast care;Warning signs for feeding  baby;Outpatient recommendation (discussed outpt if needed) Pump: Personal  Consult Status Consult Status: PRN Follow-up type: Call as needed    Vermell Madrid D Lottie Siska 10/02/2021, 12:05 PM

## 2021-10-04 NOTE — Anesthesia Postprocedure Evaluation (Signed)
Anesthesia Post Note  Patient: Haley Frye  Procedure(s) Performed: AN AD HOC LABOR EPIDURAL  Patient location during evaluation: Mother Baby Anesthesia Type: Epidural Level of consciousness: awake and alert Pain management: pain level controlled Vital Signs Assessment: post-procedure vital signs reviewed and stable Respiratory status: spontaneous breathing, nonlabored ventilation and respiratory function stable Cardiovascular status: stable Postop Assessment: able to ambulate Anesthetic complications: no   No notable events documented.   Last Vitals: There were no vitals filed for this visit.  Last Pain: There were no vitals filed for this visit.               Cleda Mccreedy Amare Kontos

## 2023-02-20 DIAGNOSIS — Z3201 Encounter for pregnancy test, result positive: Secondary | ICD-10-CM | POA: Diagnosis not present

## 2023-03-13 DIAGNOSIS — E559 Vitamin D deficiency, unspecified: Secondary | ICD-10-CM | POA: Diagnosis not present

## 2023-03-13 DIAGNOSIS — Z369 Encounter for antenatal screening, unspecified: Secondary | ICD-10-CM | POA: Diagnosis not present

## 2023-03-13 DIAGNOSIS — Z3A08 8 weeks gestation of pregnancy: Secondary | ICD-10-CM | POA: Diagnosis not present

## 2023-03-13 DIAGNOSIS — Z3481 Encounter for supervision of other normal pregnancy, first trimester: Secondary | ICD-10-CM | POA: Diagnosis not present

## 2023-03-13 DIAGNOSIS — Z3687 Encounter for antenatal screening for uncertain dates: Secondary | ICD-10-CM | POA: Diagnosis not present

## 2023-04-14 DIAGNOSIS — Z113 Encounter for screening for infections with a predominantly sexual mode of transmission: Secondary | ICD-10-CM | POA: Diagnosis not present

## 2023-06-12 DIAGNOSIS — Z3A21 21 weeks gestation of pregnancy: Secondary | ICD-10-CM | POA: Diagnosis not present

## 2023-06-12 DIAGNOSIS — Z363 Encounter for antenatal screening for malformations: Secondary | ICD-10-CM | POA: Diagnosis not present

## 2023-07-13 DIAGNOSIS — Z3483 Encounter for supervision of other normal pregnancy, third trimester: Secondary | ICD-10-CM | POA: Diagnosis not present

## 2023-07-13 DIAGNOSIS — Z3482 Encounter for supervision of other normal pregnancy, second trimester: Secondary | ICD-10-CM | POA: Diagnosis not present

## 2023-07-28 DIAGNOSIS — Z23 Encounter for immunization: Secondary | ICD-10-CM | POA: Diagnosis not present

## 2023-07-28 DIAGNOSIS — O360130 Maternal care for anti-D [Rh] antibodies, third trimester, not applicable or unspecified: Secondary | ICD-10-CM | POA: Diagnosis not present

## 2023-07-28 DIAGNOSIS — Z6711 Type A blood, Rh negative: Secondary | ICD-10-CM | POA: Diagnosis not present

## 2023-07-28 DIAGNOSIS — Z3A28 28 weeks gestation of pregnancy: Secondary | ICD-10-CM | POA: Diagnosis not present

## 2023-07-28 DIAGNOSIS — Z3689 Encounter for other specified antenatal screening: Secondary | ICD-10-CM | POA: Diagnosis not present

## 2023-09-22 DIAGNOSIS — Z369 Encounter for antenatal screening, unspecified: Secondary | ICD-10-CM | POA: Diagnosis not present

## 2023-10-13 DIAGNOSIS — O4202 Full-term premature rupture of membranes, onset of labor within 24 hours of rupture: Secondary | ICD-10-CM | POA: Diagnosis not present

## 2023-10-13 DIAGNOSIS — Z6711 Type A blood, Rh negative: Secondary | ICD-10-CM | POA: Diagnosis not present

## 2023-10-13 DIAGNOSIS — Z3A39 39 weeks gestation of pregnancy: Secondary | ICD-10-CM | POA: Diagnosis not present

## 2023-10-13 DIAGNOSIS — Z91040 Latex allergy status: Secondary | ICD-10-CM | POA: Diagnosis not present
